# Patient Record
Sex: Male | Born: 1985 | Race: Black or African American | Hispanic: No | Marital: Single | Smoking: Current every day smoker
Health system: Southern US, Community
[De-identification: ages and names within clinical notes are randomized; demographics above are authoritative.]

## PROBLEM LIST (undated history)

## (undated) DIAGNOSIS — M419 Scoliosis, unspecified: Secondary | ICD-10-CM

## (undated) HISTORY — PX: BACK SURGERY: SHX140

---
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
## 2008-04-05 ENCOUNTER — Emergency Department: Payer: Self-pay | Admitting: Emergency Medicine

## 2008-11-18 ENCOUNTER — Emergency Department: Payer: Self-pay | Admitting: Emergency Medicine

## 2008-11-20 ENCOUNTER — Emergency Department: Payer: Self-pay | Admitting: Emergency Medicine

## 2010-03-18 IMAGING — US US PELVIS LIMITED
1 series · 17 of 25 positions shown · non-contrast
Comparison: none

REASON FOR EXAM: left testicle pain
COMMENTS:

[Series 1: us pelvis limited · 17 of 60 slices shown]
[im 1/60]
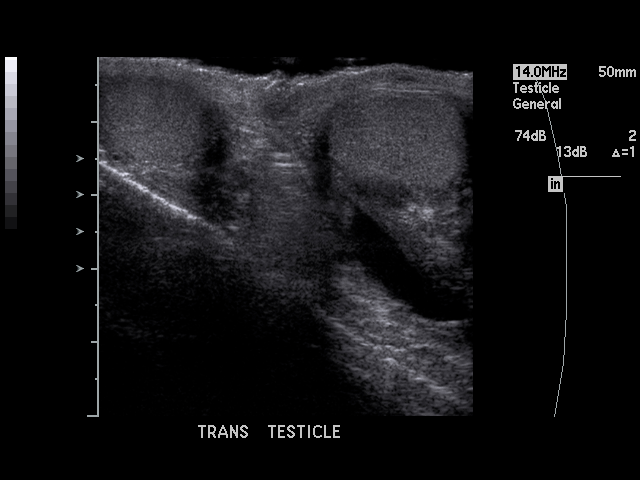
[im 5/60]
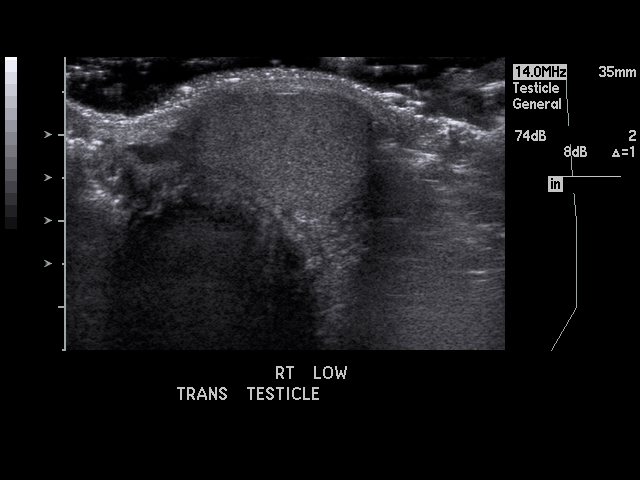
[im 8/60]
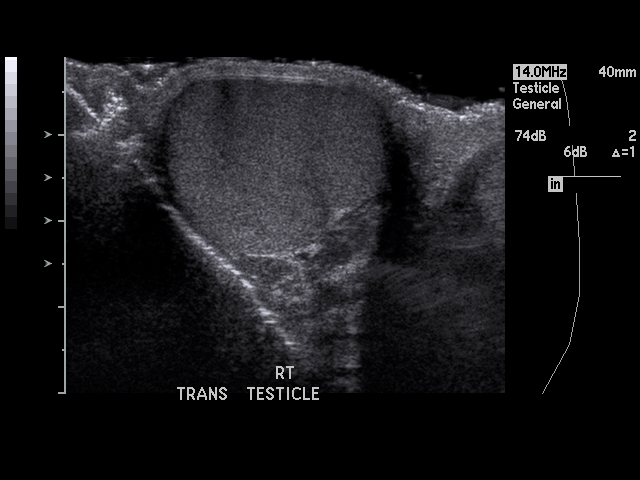
[im 13/60]
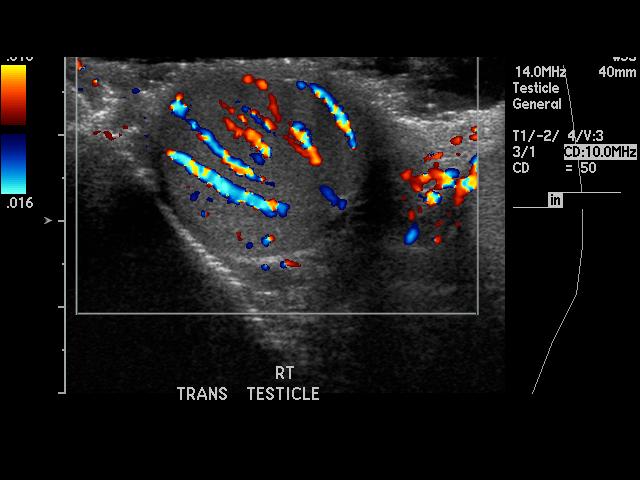
[im 15/60]
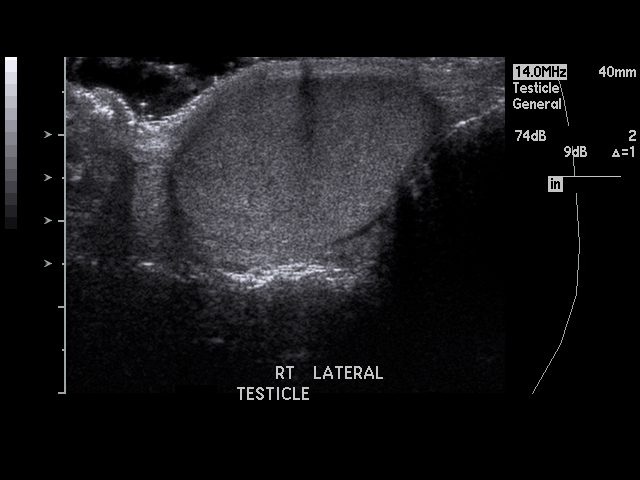
[im 20/60]
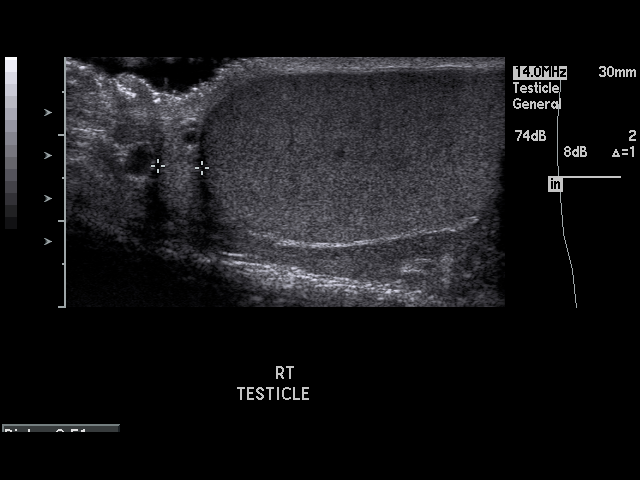
[im 23/60]
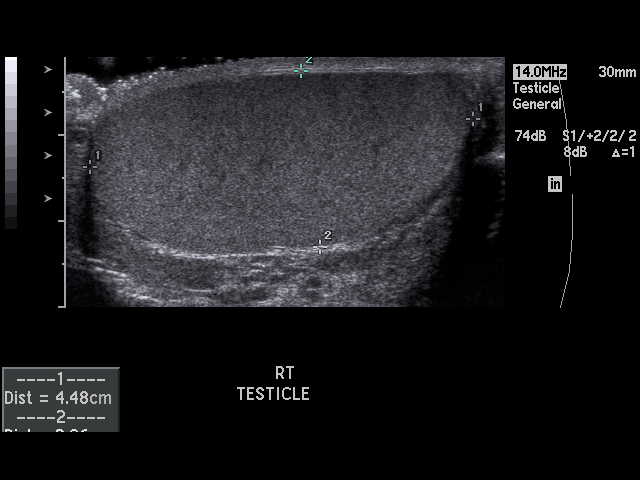
[im 28/60]
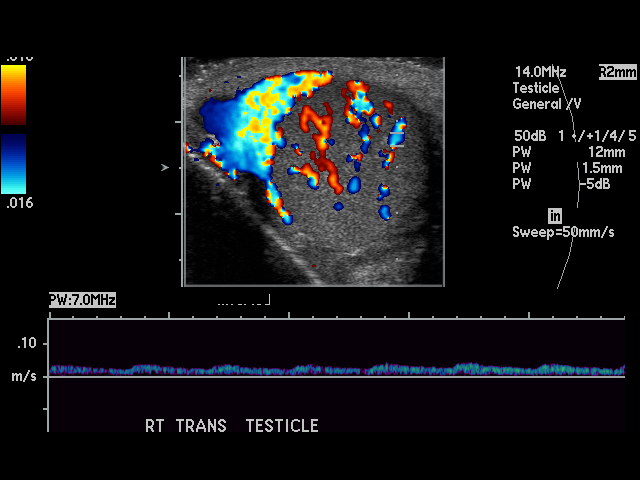
[im 30/60]
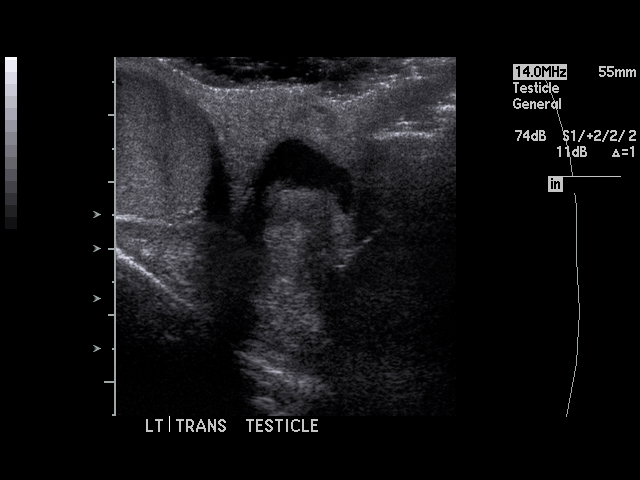
[im 32/60]
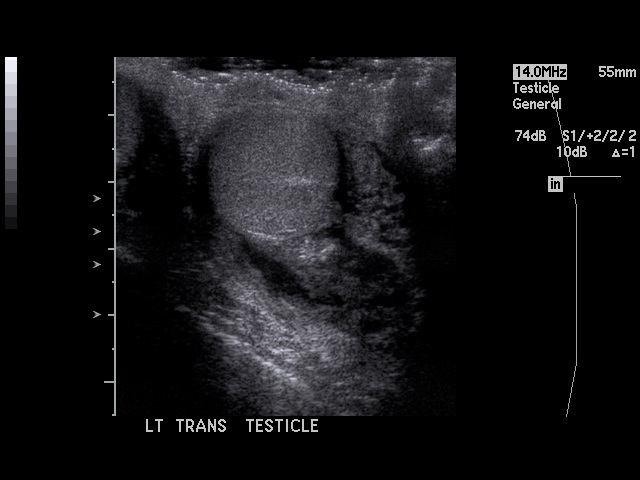
[im 37/60]
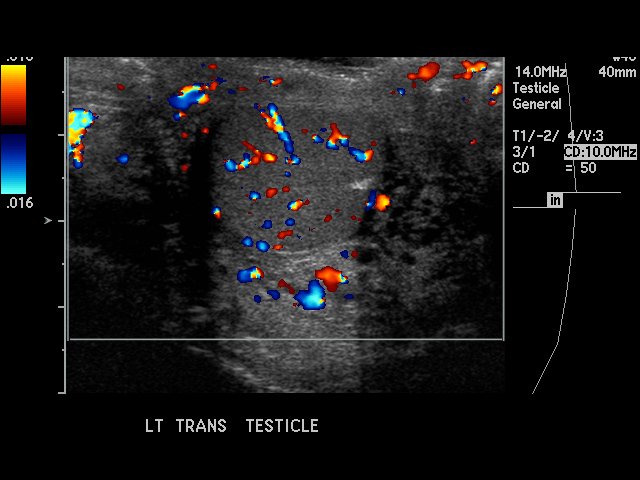
[im 40/60]
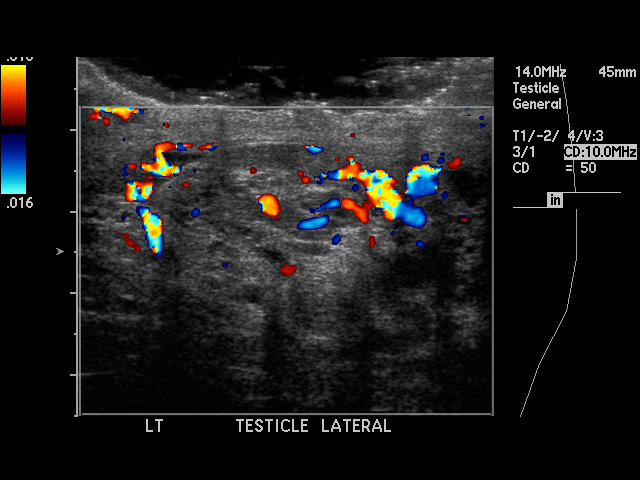
[im 45/60]
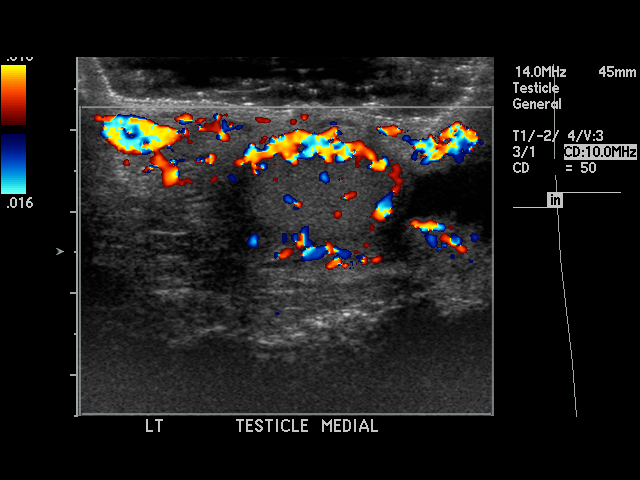
[im 47/60]
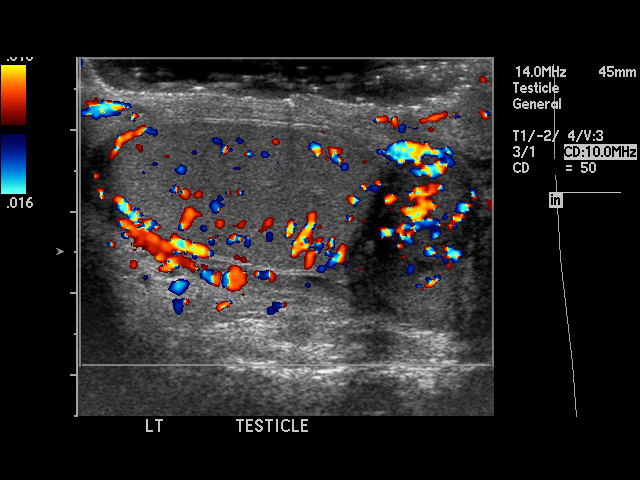
[im 52/60]
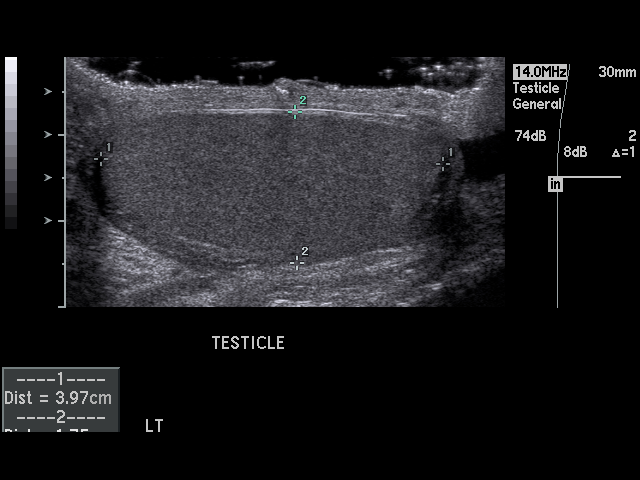
[im 55/60]
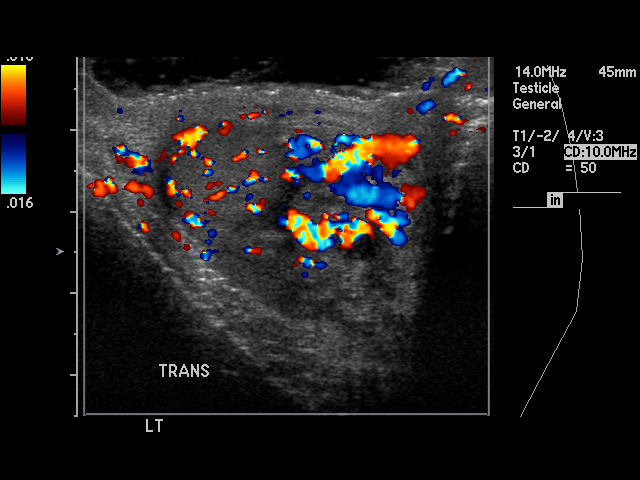
[im 60/60]
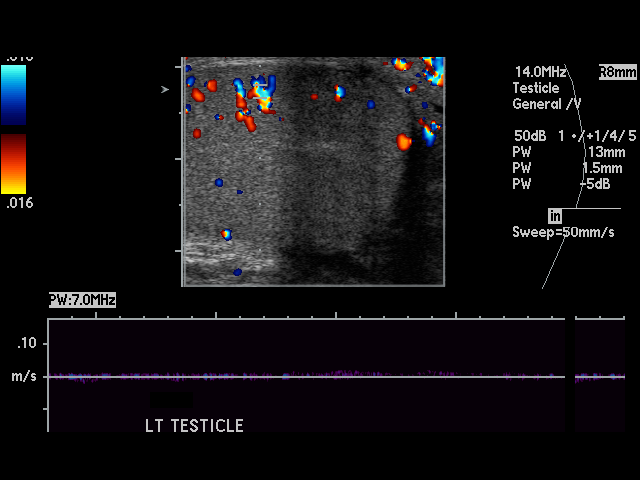

[17 of 25 positions shown; findings below may reference images not displayed]

PROCEDURE:     US  - US TESTICULAR  - April 06, 2008  [DATE]

RESULT:     Grayscale, color flow and SPECTRAL waveform imaging was
performed of the RIGHT and LEFT testicle.
The RIGHT testicle measures 4.48 x 2.06 x 2.79 cm and the LEFT 3.97 x 1.75 x
2.21 cm.  The testicles demonstrate homogenous echotexture and symmetric
flow. A small hydrocele is identified within the LEFT hemiscrotum. The RIGHT
epididymis measures 5.1 mm in diameter and the LEFT 9.8 mm.  There is
increased color flow within the LEFT epididymis when compared to the RIGHT.
There appears to be increased arterial and venous flow.  There does not
appear to be evidence of variocele within the RIGHT or LEFT hemiscrotum.
IMPRESSION: 1.     Findings suspicious for epididymitis on the LEFT as described above.
2.     Small to moderate LEFT hydrocele.
3.     Dr. Jaeoh of the Emergency Department was informed of these
findings at the time of the initial interpretation.

## 2010-03-23 ENCOUNTER — Emergency Department: Payer: Self-pay | Admitting: Emergency Medicine

## 2010-07-10 ENCOUNTER — Emergency Department (HOSPITAL_COMMUNITY)
Admission: EM | Admit: 2010-07-10 | Discharge: 2010-07-11 | Disposition: A | Discharge status: Home / Self Care | Payer: Self-pay | Attending: Emergency Medicine | Admitting: Emergency Medicine

## 2010-07-10 DIAGNOSIS — Z9889 Other specified postprocedural states: Secondary | ICD-10-CM | POA: Insufficient documentation

## 2010-07-10 DIAGNOSIS — G8929 Other chronic pain: Secondary | ICD-10-CM | POA: Insufficient documentation

## 2010-07-10 DIAGNOSIS — M545 Low back pain, unspecified: Secondary | ICD-10-CM | POA: Insufficient documentation

## 2010-07-10 DIAGNOSIS — M79609 Pain in unspecified limb: Secondary | ICD-10-CM | POA: Insufficient documentation

## 2012-10-09 ENCOUNTER — Emergency Department: Payer: Self-pay | Admitting: Emergency Medicine

## 2012-10-13 LAB — BETA STREP CULTURE(ARMC)

## 2013-05-04 ENCOUNTER — Emergency Department: Payer: Self-pay | Admitting: Emergency Medicine

## 2013-11-07 ENCOUNTER — Emergency Department: Payer: Self-pay | Admitting: Emergency Medicine

## 2013-12-24 ENCOUNTER — Emergency Department: Payer: Self-pay | Admitting: Emergency Medicine

## 2014-01-07 ENCOUNTER — Emergency Department: Payer: Self-pay | Admitting: Emergency Medicine

## 2014-02-11 ENCOUNTER — Emergency Department: Payer: Self-pay | Admitting: Emergency Medicine

## 2014-02-27 ENCOUNTER — Emergency Department: Payer: Self-pay | Admitting: Emergency Medicine

## 2014-06-05 ENCOUNTER — Emergency Department: Payer: Self-pay | Admitting: Emergency Medicine

## 2014-06-17 ENCOUNTER — Emergency Department: Payer: Self-pay | Admitting: Emergency Medicine

## 2015-01-17 ENCOUNTER — Emergency Department
Admission: EM | Admit: 2015-01-17 | Discharge: 2015-01-17 | Disposition: A | Discharge status: Home / Self Care | Payer: Self-pay | Attending: Emergency Medicine | Admitting: Emergency Medicine

## 2015-01-17 ENCOUNTER — Encounter: Payer: Self-pay | Admitting: Emergency Medicine

## 2015-01-17 DIAGNOSIS — B9789 Other viral agents as the cause of diseases classified elsewhere: Secondary | ICD-10-CM

## 2015-01-17 DIAGNOSIS — J028 Acute pharyngitis due to other specified organisms: Secondary | ICD-10-CM

## 2015-01-17 DIAGNOSIS — Z720 Tobacco use: Secondary | ICD-10-CM | POA: Insufficient documentation

## 2015-01-17 DIAGNOSIS — J029 Acute pharyngitis, unspecified: Secondary | ICD-10-CM | POA: Insufficient documentation

## 2015-01-17 HISTORY — DX: Scoliosis, unspecified: M41.9

## 2015-01-17 MED ORDER — LORATADINE-PSEUDOEPHEDRINE ER 5-120 MG PO TB12: 1.0000 | ORAL_TABLET | Freq: Two times a day (BID) | ORAL | Status: AC

## 2015-01-17 NOTE — ED Notes (Signed)
Sore throat since last pm  States increased pain with swallowing. No resp distress .Marland Kitchen

## 2015-01-17 NOTE — ED Provider Notes (Signed)
[facility] Emergency Department Provider Note  ____________________________________________  Time seen: Approximately 2:02 PM  I have reviewed the triage vital signs and the nursing notes.   HISTORY  Chief Complaint Sore Throat  HPI Hector Fischer is a 29 y.o. male is here with complaint of sore throat since last evening. Patient states that pain is increased with swallowing. He denies any respiratory difficulty. He denies any fever or chills. He is unaware of any exposure to strep throat. He states he has not taken any medication for this. He acknowledges that he has been sneezing and has been sniffing recently. He rates his discomfort as 8 out of 10.   Past Medical History  Diagnosis Date  . Scoliosis     There are no active problems to display for this patient.   Past Surgical History  Procedure Laterality Date  . Back surgery      Current Outpatient Rx  Name  Route  Sig  Dispense  Refill  . loratadine-pseudoephedrine (CLARITIN-D 12-HOUR) 5-120 MG tablet   Oral   Take 1 tablet by mouth 2 (two) times daily.   20 tablet   0     Allergies Review of patient's allergies indicates no known allergies.  No family history on file.  Social History Social History  Substance Use Topics  . Smoking status: Current Every Day Smoker  . Smokeless tobacco: None  . Alcohol Use: No    Review of Systems Constitutional: No fever/chills Eyes: No visual changes. ENT: Positive sore throat. Positive sneezing Cardiovascular: Denies chest pain. Respiratory: Denies shortness of breath. Gastrointestinal: No abdominal pain.  No nausea, no vomiting.   Genitourinary: Negative for dysuria. Musculoskeletal: Negative for back pain. Skin: Negative for rash. Neurological: Negative for headaches, focal weakness or numbness.  10-point ROS otherwise negative.  ____________________________________________   PHYSICAL EXAM:  VITAL SIGNS: ED Triage Vitals   Enc Vitals Group     BP 01/17/15 1331 135/75 mmHg     Pulse Rate 01/17/15 1331 73     Resp 01/17/15 1331 16     Temp 01/17/15 1331 98.7 F (37.1 C)     Temp Source 01/17/15 1331 Oral     SpO2 01/17/15 1331 97 %     Weight 01/17/15 1331 197 lb (89.359 kg)     Height 01/17/15 1331  (1.803 m)     Head Cir --      Peak Flow --      Pain Score 01/17/15 1331 8     Pain Loc --      Pain Edu? --      Excl. in GC? --     Constitutional: Alert and oriented. Well appearing and in no acute distress. Eyes: Conjunctivae are normal. PERRL. EOMI. Head: Atraumatic. Nose: Positive congestion/no rhinnorhea. Patient with frequent sniffles. Mouth/Throat: Mucous membranes are moist.  Oropharynx non-erythematous. Moderate amount of posterior drainage seen. Neck: No stridor.   Hematological/Lymphatic/Immunilogical: No cervical lymphadenopathy. Cardiovascular: Normal rate, regular rhythm. Grossly normal heart sounds.  Good peripheral circulation. Respiratory: Normal respiratory effort.  No retractions. Lungs CTAB. Gastrointestinal: Soft and nontender. No distention. Musculoskeletal: Moves upper extremities without any difficulty. No lower extremity tenderness nor edema.  No joint effusions. Neurologic:  Normal speech and language. No gross focal neurologic deficits are appreciated. No gait instability. Skin:  Skin is warm, dry and intact. No rash noted. Psychiatric: Mood and affect are normal. Speech and behavior are normal.  ____________________________________________   LABS (all labs ordered are  listed, but only abnormal results are displayed)  Labs Reviewed  CULTURE, GROUP A STREP ([facility] ONLY)    PROCEDURES  Procedure(s) performed: None  Critical Care performed: No  ____________________________________________   INITIAL IMPRESSION / ASSESSMENT AND PLAN / ED COURSE  Pertinent labs & imaging results that were available during my care of the patient were reviewed by me and  considered in my medical decision making (see chart for details).  Patient was started on Claritin-D every 12 hours. He was encouraged take Tylenol or ibuprofen as needed for throat pain. He is also to increase fluids. He is to follow-up with [facility] if any continued problems. ____________________________________________   FINAL CLINICAL IMPRESSION(S) / ED DIAGNOSES  Final diagnoses:  Acute viral pharyngitis      Tommi Rumps, PA-C 01/17/15 1455  Jene Every, MD 01/17/15 [phone]

## 2015-01-17 NOTE — ED Notes (Signed)
Pt reports sore throat since last night, reports difficulty swallowing. Pt handling secretions well and in no respiratory distress.

## 2015-01-17 NOTE — Discharge Instructions (Signed)

## 2015-01-19 LAB — CULTURE, GROUP A STREP (THRC)

## 2015-03-30 ENCOUNTER — Emergency Department
Admission: EM | Admit: 2015-03-30 | Discharge: 2015-03-30 | Disposition: A | Discharge status: Home / Self Care | Payer: Self-pay | Attending: Emergency Medicine | Admitting: Emergency Medicine

## 2015-03-30 ENCOUNTER — Encounter: Payer: Self-pay | Admitting: Emergency Medicine

## 2015-03-30 DIAGNOSIS — M436 Torticollis: Secondary | ICD-10-CM | POA: Insufficient documentation

## 2015-03-30 DIAGNOSIS — Z79899 Other long term (current) drug therapy: Secondary | ICD-10-CM | POA: Insufficient documentation

## 2015-03-30 DIAGNOSIS — M419 Scoliosis, unspecified: Secondary | ICD-10-CM | POA: Insufficient documentation

## 2015-03-30 DIAGNOSIS — F17200 Nicotine dependence, unspecified, uncomplicated: Secondary | ICD-10-CM | POA: Insufficient documentation

## 2015-03-30 DIAGNOSIS — G8929 Other chronic pain: Secondary | ICD-10-CM | POA: Insufficient documentation

## 2015-03-30 DIAGNOSIS — M549 Dorsalgia, unspecified: Secondary | ICD-10-CM | POA: Insufficient documentation

## 2015-03-30 MED ORDER — CYCLOBENZAPRINE HCL 10 MG PO TABS: 10.0000 mg | ORAL_TABLET | Freq: Every day | ORAL | Status: DC

## 2015-03-30 MED ORDER — NAPROXEN 500 MG PO TABS: 500.0000 mg | ORAL_TABLET | Freq: Two times a day (BID) | ORAL | Status: AC

## 2015-03-30 NOTE — ED Provider Notes (Signed)
CSN: [id number]     Arrival date & time 03/30/15  1011 History   First MD Initiated Contact with Patient 03/30/15 1050     Chief Complaint  Patient presents with  . Back Pain  . Leg Pain      HPI Comments: 29 year old male with a history of scoliosis presents today complaining of neck and back pain that started around 3am. Pt has a long history of chronic back pain due to scoliosis that has required surgery. He is not being followed by anyone for his back pain. He is most concerned that his neck hurts and it radiates into his right arm. He does not do any heavy lifting in his job, took an old prescription of a muscle relaxer around 3am with some relief. No tingling or numbness down his arms or legs. No loss of bowel or bladder function.   The history is provided by the patient.    Past Medical History  Diagnosis Date  . Scoliosis    Past Surgical History  Procedure Laterality Date  . Back surgery     No family history on file. Social History  Substance Use Topics  . Smoking status: Current Every Day Smoker  . Smokeless tobacco: None  . Alcohol Use: Yes    Review of Systems  Musculoskeletal: Positive for myalgias, back pain, arthralgias and neck pain.  Neurological: Negative for weakness and numbness.  All other systems reviewed and are negative.     Allergies  Review of patient's allergies indicates no known allergies.  Home Medications   Prior to Admission medications   Medication Sig Start Date End Date Taking? Authorizing Provider  cyclobenzaprine (FLEXERIL) 10 MG tablet Take 1 tablet (10 mg total) by mouth at bedtime. 03/30/15   Christella Scheuermann, PA-C  loratadine-pseudoephedrine (CLARITIN-D 12-HOUR) 5-120 MG tablet Take 1 tablet by mouth 2 (two) times daily. 01/17/15   Tommi Rumps, PA-C  naproxen (NAPROSYN) 500 MG tablet Take 1 tablet (500 mg total) by mouth 2 (two) times daily with a meal. 03/30/15   Christella Scheuermann, PA-C   BP 142/81 mmHg  Pulse 59   Temp(Src) 98.1 F (36.7 C) (Oral)  Resp 18  Ht  (1.803 m)  Wt 97.523 kg  BMI 30.00 kg/m2  SpO2 99% Physical Exam  Constitutional: He is oriented to person, place, and time. Vital signs are normal. He appears well-developed and well-nourished. He is active.  Non-toxic appearance. He does not have a sickly appearance. He does not appear ill.  HENT:  Head: Normocephalic and atraumatic.  Neck: Trachea normal and phonation normal. Neck supple. Muscular tenderness present. No spinous process tenderness present. No rigidity.  Noted muscle spasm left trapezius   Musculoskeletal: He exhibits tenderness.       Right hip: Normal. He exhibits normal range of motion and no tenderness.       Left hip: Normal. He exhibits normal range of motion and no tenderness.       Lumbar back: He exhibits tenderness. He exhibits normal range of motion.  Lymphadenopathy:    He has no cervical adenopathy.  Neurological: He is alert and oriented to person, place, and time. He has normal strength. No sensory deficit. Gait normal.  Equal strength 5/5 bilateral LE. Negative SLR bilaterally.   Skin: Skin is warm and dry.  Psychiatric: He has a normal mood and affect. His behavior is normal. Judgment and thought content normal.  Nursing note and vitals reviewed.   ED  Course  Procedures (including critical care time) Labs Review Labs Reviewed - No data to display  Imaging Review No results found. I have personally reviewed and evaluated these images and lab results as part of my medical decision-making.   EKG Interpretation None      MDM  Pt with chronic back pain, no red flag warning signs associated with it Has noted muscle spasm in neck, non traumatic neck pain - no indication for imaging Naproxen BId with food, flexeril QHS - do not drive or work after taking MOist heat, stretching for neck and back. Encouraged him to establish care with a primary care doctor for caer of chronic LBP Final  diagnoses:  Scoliosis  Torticollis      Christella Scheuermannmma Emillia Weatherly V, PA-C 03/30/15 1158  Emily FilbertJonathan E Williams, MD 03/30/15 [phone]

## 2015-03-30 NOTE — ED Notes (Signed)
Says woke up this am with pain in back, right side/shoulder and leg.  Pain increases with movement.  Does not recall any injury, but was okay yesterday.  No fever, no n/v.

## 2015-03-30 NOTE — Discharge Instructions (Signed)
Acute Torticollis °Torticollis is a condition in which the muscles of the neck tighten (contract) abnormally, causing the neck to twist and the head to move into an unnatural position. Torticollis that develops suddenly is called acute torticollis. If torticollis becomes chronic and is left untreated, the face and neck can become deformed. °CAUSES °This condition may be caused by: °· Sleeping in an awkward position (common). °· Extending or twisting the neck muscles beyond their normal position. °· Infection. °In some cases, the cause may not be known. °SYMPTOMS °Symptoms of this condition include: °· An unnatural position of the head. °· Neck pain. °· A limited ability to move the neck. °· Twisting of the neck to one side. °DIAGNOSIS °This condition is diagnosed with a physical exam. You may also have imaging tests, such as an X-ray, CT scan, or MRI. °TREATMENT °Treatment for this condition involves trying to relax the neck muscles. It may include: °· Medicines or shots. °· Physical therapy. °· Surgery. This may be done in severe cases. °HOME CARE INSTRUCTIONS °· Take medicines only as directed by your health care provider. °· Do stretching exercises and massage your neck as directed by your health care provider. °· Keep all follow-up visits as directed by your health care provider. This is important. °SEEK MEDICAL CARE IF: °· You develop a fever. °SEEK IMMEDIATE MEDICAL CARE IF: °· You develop difficulty breathing. °· You develop noisy breathing (stridor). °· You start drooling. °· You have trouble swallowing or have pain with swallowing. °· You develop numbness or weakness in your hands or feet. °· You have changes in your speech, understanding, or vision. °· Your pain gets worse. °  °This information is not intended to replace advice given to you by your health care provider. Make sure you discuss any questions you have with your health care provider. °  °Document Released: 03/24/2000 Document Revised:  08/11/2014 Document Reviewed: 03/23/2014 °Elsevier Interactive Patient Education ©2016 [employer] ° °

## 2016-01-23 IMAGING — CR DG CHEST 2V
1 series · 2 of 2 positions shown · non-contrast
Comparison: 01/07/2014

CLINICAL DATA: Cough and dyspnea.

EXAM:
CHEST  2 VIEW

[Series 1: dxr chest pa (or ap) and lateral · 0.14mm/px · 2 of 2 slices shown]
[im 1/2]
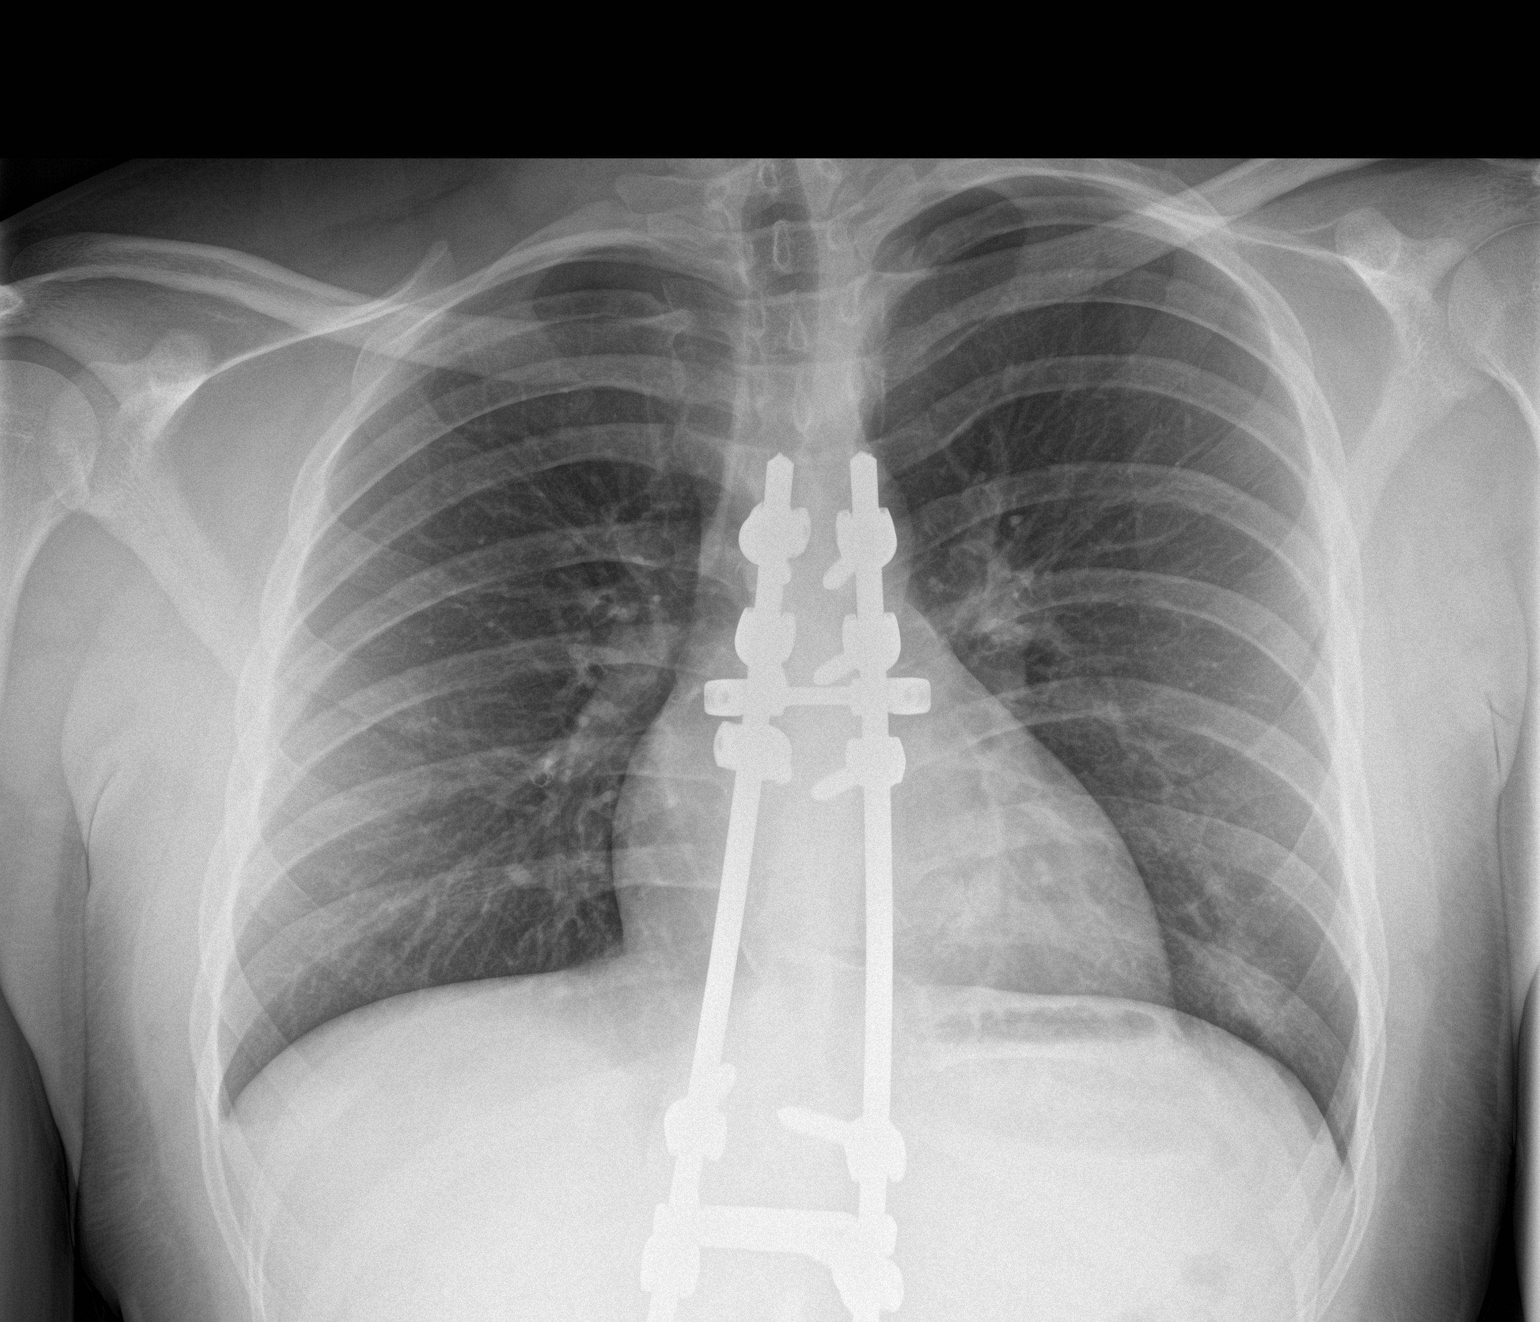
[im 2/2]
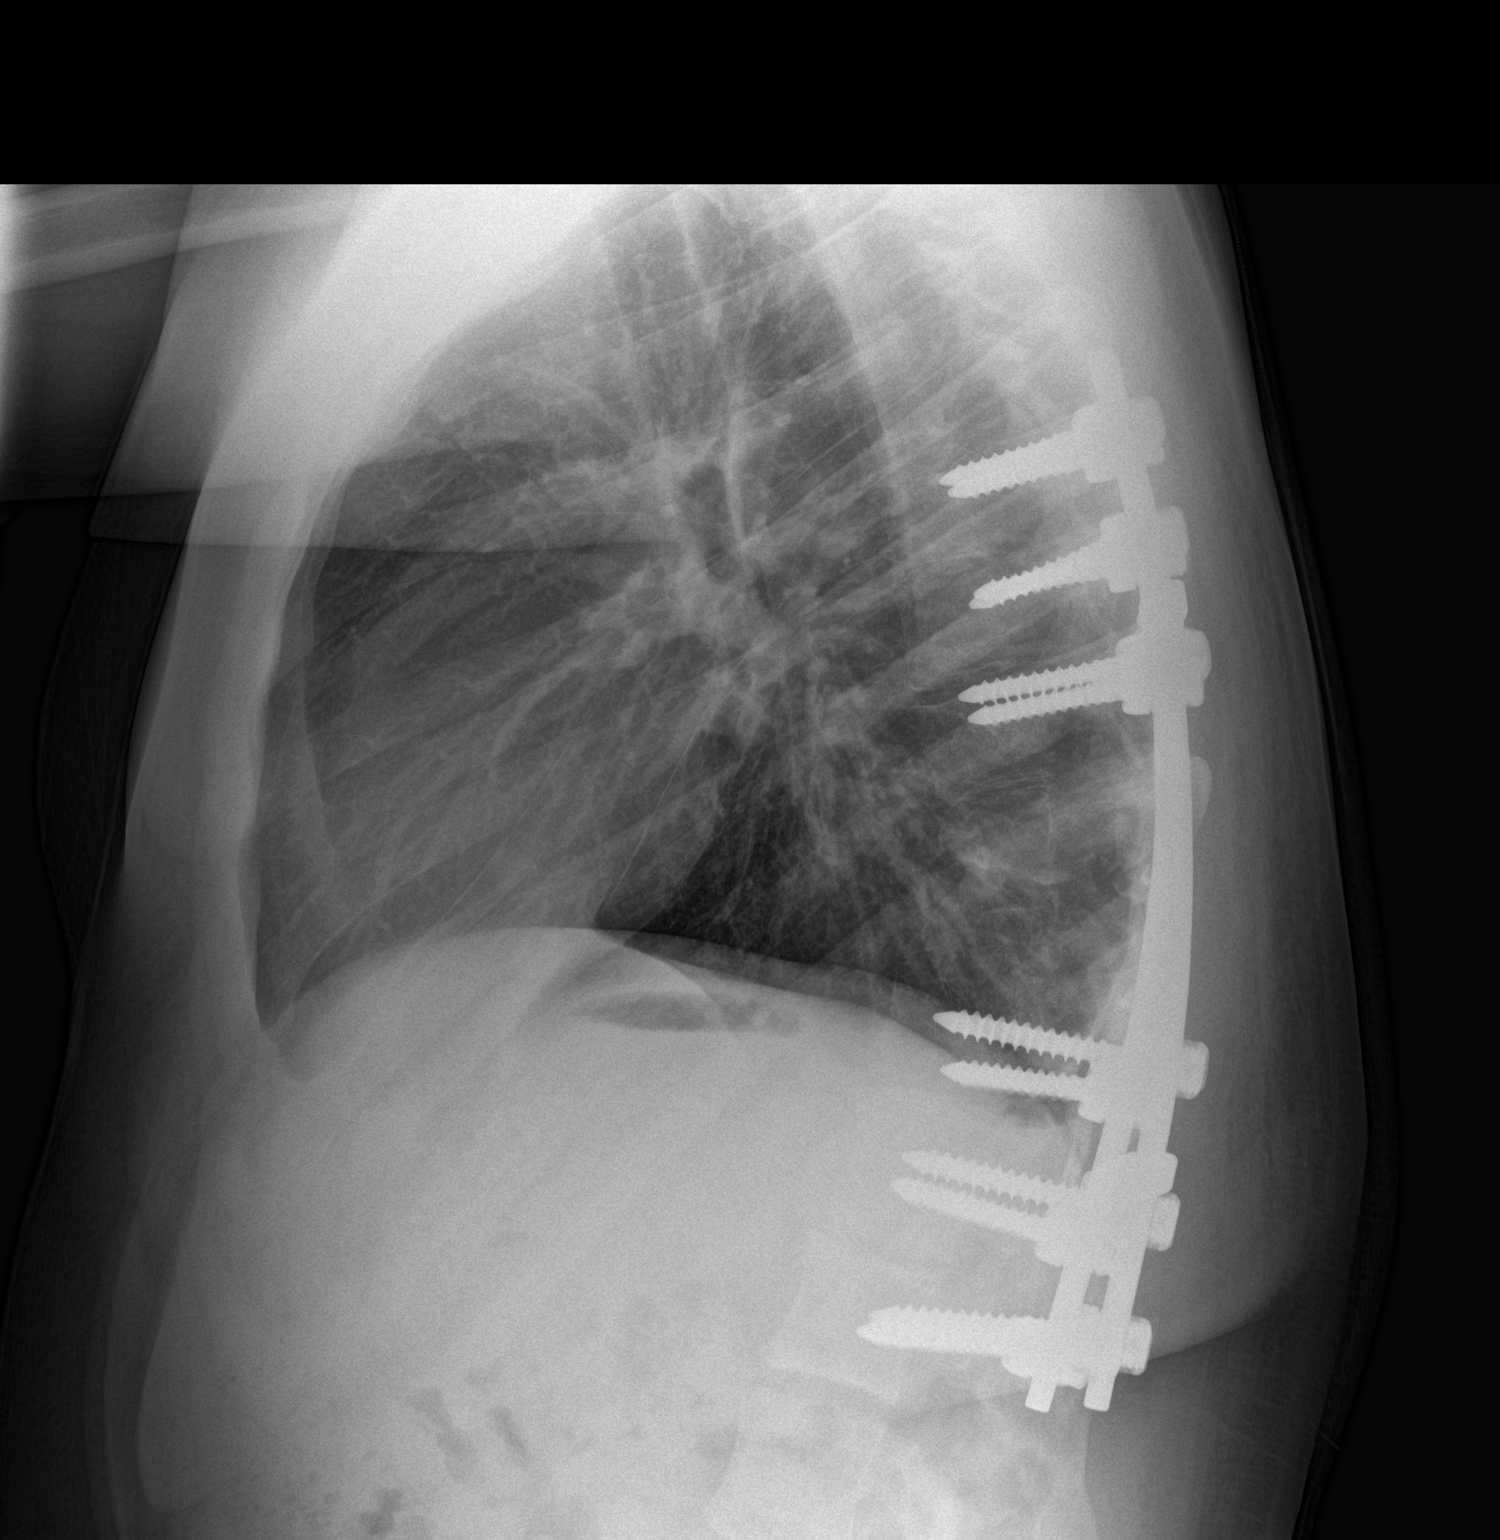

[2 of 2 positions shown; findings below may reference images not displayed]

FINDINGS: Two views of the chest again demonstrate surgical hardware in the
thoracic and lumbar spine. Stable appearance of the spine on the
lateral view. There are subtle densities at the left lung base.
Remainder of the lungs are clear. Heart size is normal.
IMPRESSION: Subtle densities at the left lung base. Findings could be related to
overlying shadows but cannot exclude a subtle focus of infection.

## 2016-09-18 ENCOUNTER — Encounter: Payer: Self-pay | Admitting: Emergency Medicine

## 2016-09-18 ENCOUNTER — Emergency Department
Admission: EM | Admit: 2016-09-18 | Discharge: 2016-09-18 | Disposition: A | Discharge status: Home / Self Care | Payer: BLUE CROSS/BLUE SHIELD | Attending: Emergency Medicine | Admitting: Emergency Medicine

## 2016-09-18 DIAGNOSIS — B349 Viral infection, unspecified: Secondary | ICD-10-CM | POA: Diagnosis not present

## 2016-09-18 DIAGNOSIS — R509 Fever, unspecified: Secondary | ICD-10-CM | POA: Diagnosis not present

## 2016-09-18 DIAGNOSIS — F17200 Nicotine dependence, unspecified, uncomplicated: Secondary | ICD-10-CM | POA: Insufficient documentation

## 2016-09-18 DIAGNOSIS — J029 Acute pharyngitis, unspecified: Secondary | ICD-10-CM | POA: Diagnosis present

## 2016-09-18 LAB — POCT RAPID STREP A: Streptococcus, Group A Screen (Direct): NEGATIVE

## 2016-09-18 MED ORDER — MAGIC MOUTHWASH W/LIDOCAINE: 5.0000 mL | Freq: Four times a day (QID) | ORAL | 0 refills | Status: AC

## 2016-09-18 MED ORDER — IBUPROFEN 800 MG PO TABS: 800.0000 mg | ORAL_TABLET | Freq: Three times a day (TID) | ORAL | 0 refills | Status: DC | PRN

## 2016-09-18 MED ORDER — PSEUDOEPH-BROMPHEN-DM 30-2-10 MG/5ML PO SYRP: 5.0000 mL | ORAL_SOLUTION | Freq: Four times a day (QID) | ORAL | 0 refills | Status: AC | PRN

## 2016-09-18 MED ORDER — KETOROLAC TROMETHAMINE 60 MG/2ML IM SOLN
60.0000 mg | Freq: Once | INTRAMUSCULAR | Status: AC
  Administered 2016-09-18: 60 mg via INTRAMUSCULAR
  Filled 2016-09-18: qty 2

## 2016-09-18 NOTE — ED Triage Notes (Signed)
States he developed sore throat and body aches this am

## 2016-09-18 NOTE — ED Provider Notes (Signed)
[facility] Emergency Department Provider Note   ____________________________________________   None    (approximate)  I have reviewed the triage vital signs and the nursing notes.   HISTORY  Chief Complaint Sore Throat and Fever    HPI Hector Fischer is a 31 y.o. male . Patient complaining of sore throat and body aches upon a.m. Awakening.  Patient also stated subjective fever and chills. Patient rates the pain as a 7/10. She describes pain as "achy". No palliative measures for complaint.   Past Medical History:  Diagnosis Date  . Scoliosis     There are no active problems to display for this patient.   Past Surgical History:  Procedure Laterality Date  . BACK SURGERY      Prior to Admission medications   Medication Sig Start Date End Date Taking? Authorizing Provider  brompheniramine-pseudoephedrine-DM 30-2-10 MG/5ML syrup Take 5 mLs by mouth 4 (four) times daily as needed. 09/18/16   Joni ReiningSmith, Duane Earnshaw K, PA-C  cyclobenzaprine (FLEXERIL) 10 MG tablet Take 1 tablet (10 mg total) by mouth at bedtime. 03/30/15   Christella ScheuermannLawrence, Emma V, PA-C  ibuprofen (ADVIL,MOTRIN) 800 MG tablet Take 1 tablet (800 mg total) by mouth every 8 (eight) hours as needed for moderate pain. 09/18/16   Joni ReiningSmith, Herold Salguero K, PA-C  loratadine-pseudoephedrine (CLARITIN-D 12-HOUR) 5-120 MG tablet Take 1 tablet by mouth 2 (two) times daily. 01/17/15   Tommi RumpsSummers, Rhonda L, PA-C  magic mouthwash w/lidocaine SOLN Take 5 mLs by mouth 4 (four) times daily. 09/18/16   Joni ReiningSmith, Karli Wickizer K, PA-C  naproxen (NAPROSYN) 500 MG tablet Take 1 tablet (500 mg total) by mouth 2 (two) times daily with a meal. 03/30/15   Christella ScheuermannLawrence, Emma V, PA-C    Allergies Patient has no known allergies.  No family history on file.  Social History Social History  Substance Use Topics  . Smoking status: Current Every Day Smoker  . Smokeless tobacco: Never Used  . Alcohol use Yes    Review of Systems  Constitutional:   Fever and body ache Eyes: No visual changes. ENT:  Sore throat  And nasal congestion Cardiovascular: Denies chest pain. Respiratory: Denies shortness of breath.  And nonproductive cough Musculoskeletal:  And diffuse body ache Skin: Negative for rash. Neurological: Negative for headaches, focal weakness or numbness.  ____________________________________________   PHYSICAL EXAM:  VITAL SIGNS: ED Triage Vitals  Enc Vitals Group     BP 09/18/16 1043 137/72     Pulse Rate 09/18/16 1043 (!) 101     Resp 09/18/16 1043 18     Temp 09/18/16 1043 99.4 F (37.4 C)     Temp Source 09/18/16 1043 Oral     SpO2 09/18/16 1043 98 %     Weight 09/18/16 1044 230 lb (104.3 kg)     Height 09/18/16 1044 5\' 11"  (1.803 m)     Head Circumference --      Peak Flow --      Pain Score 09/18/16 1049 7     Pain Loc --      Pain Edu? --      Excl. in GC? --     Constitutional: Alert and oriented. Well appearing and in no acute distress. Low-grade fever Eyes: Conjunctivae are normal. PERRL. EOMI. Head: Atraumatic. Nose:  Edematous nasal terms postnasal drainage. Mouth/Throat: Mucous membranes are moist.  Oropharynx non-erythematous.  Edematous tonsils without exudate. Neck: No stridor.  No cervical spine tenderness to palpation. Cardiovascular: Normal rate, regular rhythm. Grossly normal  heart sounds.  Good peripheral circulation. Respiratory: Normal respiratory effort.  No retractions. Lungs CTAB. Gastrointestinal: Soft and nontender. No distention. No abdominal bruits. No CVA tenderness. Musculoskeletal: No lower extremity tenderness nor edema.  No joint effusions. Neurologic:  Normal speech and language. No gross focal neurologic deficits are appreciated. No gait instability. Skin:  Skin is warm, dry and intact. No rash noted. Psychiatric: Mood and affect are normal. Speech and behavior are normal.  ____________________________________________   LABS (all labs ordered are listed, but only  abnormal results are displayed)  Labs Reviewed  POCT RAPID STREP A   ____________________________________________  EKG   ____________________________________________  RADIOLOGY  No results found.  ____________________________________________   PROCEDURES  Procedure(s) performed: None  Procedures  Critical Care performed: No  ____________________________________________   INITIAL IMPRESSION / ASSESSMENT AND PLAN / ED COURSE  Pertinent labs & imaging results that were available during my care of the patient were reviewed by me and considered in my medical decision making (see chart for details).  Viral illness. Discussed negative rapid strep test with patient advised culture pending. Patient given discharge care instructions.      ____________________________________________   FINAL CLINICAL IMPRESSION(S) / ED DIAGNOSES  Final diagnoses:  Viral illness  Sore throat      NEW MEDICATIONS STARTED DURING THIS VISIT:  New Prescriptions   BROMPHENIRAMINE-PSEUDOEPHEDRINE-DM 30-2-10 MG/5ML SYRUP    Take 5 mLs by mouth 4 (four) times daily as needed.   IBUPROFEN (ADVIL,MOTRIN) 800 MG TABLET    Take 1 tablet (800 mg total) by mouth every 8 (eight) hours as needed for moderate pain.   MAGIC MOUTHWASH W/LIDOCAINE SOLN    Take 5 mLs by mouth 4 (four) times daily.     Note:  This document was prepared using Dragon voice recognition software and may include unintentional dictation errors.    Joni Reining, PA-C 09/18/16 1136    Merrily Brittle, MD 09/18/16 [phone]

## 2022-05-20 ENCOUNTER — Encounter (HOSPITAL_COMMUNITY): Payer: Self-pay

## 2022-05-20 ENCOUNTER — Other Ambulatory Visit: Payer: Self-pay

## 2022-05-20 ENCOUNTER — Emergency Department (HOSPITAL_COMMUNITY)
Admission: EM | Admit: 2022-05-20 | Discharge: 2022-05-20 | Disposition: A | Discharge status: Home / Self Care | Payer: BC Managed Care – PPO | Attending: Emergency Medicine | Admitting: Emergency Medicine

## 2022-05-20 DIAGNOSIS — L02211 Cutaneous abscess of abdominal wall: Secondary | ICD-10-CM | POA: Insufficient documentation

## 2022-05-20 DIAGNOSIS — L0291 Cutaneous abscess, unspecified: Secondary | ICD-10-CM

## 2022-05-20 MED ORDER — DOXYCYCLINE HYCLATE 100 MG PO CAPS: 100.0000 mg | ORAL_CAPSULE | Freq: Two times a day (BID) | ORAL | 0 refills | Status: AC

## 2022-05-20 MED ORDER — KETOROLAC TROMETHAMINE 30 MG/ML IJ SOLN
30.0000 mg | Freq: Once | INTRAMUSCULAR | Status: AC
  Administered 2022-05-20: 30 mg via INTRAMUSCULAR
  Filled 2022-05-20: qty 1

## 2022-05-20 MED ORDER — LIDOCAINE-EPINEPHRINE (PF) 2 %-1:200000 IJ SOLN
10.0000 mL | Freq: Once | INTRAMUSCULAR | Status: AC
  Administered 2022-05-20: 10 mL
  Filled 2022-05-20: qty 20

## 2022-05-20 NOTE — ED Provider Notes (Signed)
[facility] Provider Note   CSN: [id number] Arrival date & time: 05/20/22  1626     History  Chief Complaint  Patient presents with   Abscess    Hector Fischer is a 37 y.o. male, no pertinent past medical history, who presents to the ED secondary to swelling, redness to the left lower abdomen, for the last year.  He states that he intermittently gets abscesses or bumps, last one was on his armpit and had a drain, but the last year he has had some swelling, redness to the left lower abdomen, that is very painful.  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  States that is really bugging him and the pain is so bad, that he cannot go to work.     Home Medications Prior to Admission medications   Medication Sig Start Date End Date Taking? Authorizing Provider  doxycycline (VIBRAMYCIN) 100 MG capsule Take 1 capsule (100 mg total) by mouth 2 (two) times daily. 05/20/22  Yes Merrill Villarruel L, PA  brompheniramine-pseudoephedrine-DM 30-2-10 MG/5ML syrup Take 5 mLs by mouth 4 (four) times daily as needed. 09/18/16   Sable Feil, PA-C  cyclobenzaprine (FLEXERIL) 10 MG tablet Take 1 tablet (10 mg total) by mouth at bedtime. 03/30/15   Harvest Dark, PA-C  ibuprofen (ADVIL,MOTRIN) 800 MG tablet Take 1 tablet (800 mg total) by mouth every 8 (eight) hours as needed for moderate pain. 09/18/16   Sable Feil, PA-C  loratadine-pseudoephedrine (CLARITIN-D 12-HOUR) 5-120 MG tablet Take 1 tablet by mouth 2 (two) times daily. 01/17/15   Johnn Hai, PA-C  magic mouthwash w/lidocaine SOLN Take 5 mLs by mouth 4 (four) times daily. 09/18/16   Sable Feil, PA-C  naproxen (NAPROSYN) 500 MG tablet Take 1 tablet (500 mg total) by mouth 2 (two) times daily with a meal. 03/30/15   Harvest Dark, PA-C      Allergies    Hydrocodone-acetaminophen    Review of Systems   Review  of Systems  Skin:  Positive for rash.    Physical Exam Updated Vital Signs BP (!) 164/99   Pulse 85   Temp 98.1 F (36.7 C) (Oral)   Resp 17   Ht 5' 11"$  (1.803 m)   Wt 104.3 kg   SpO2 99%   BMI 32.08 kg/m  Physical Exam Vitals and nursing note reviewed.  Constitutional:      General: He is not in acute distress.    Appearance: He is well-developed.  HENT:     Head: Normocephalic and atraumatic.  Eyes:     Conjunctiva/sclera: Conjunctivae normal.  Cardiovascular:     Rate and Rhythm: Normal rate and regular rhythm.     Heart sounds: No murmur heard. Pulmonary:     Effort: Pulmonary effort is normal. No respiratory distress.     Breath sounds: Normal breath sounds.  Abdominal:     Palpations: Abdomen is soft.     Tenderness: There is no abdominal tenderness.  Musculoskeletal:        General: No swelling.     Cervical back: Neck supple.  Skin:    General: Skin is warm and dry.     Capillary Refill: Capillary refill takes less than 2 seconds.     Comments: Erythema next to the left lower beltline, with a induration, and then a fluctuant mass, laterally  to the induration, that is pustular.  Neurological:     Mental Status: He is alert.  Psychiatric:        Mood and Affect: Mood normal.     ED Results / Procedures / Treatments   Labs (all labs ordered are listed, but only abnormal results are displayed) Labs Reviewed - No data to display  EKG None  Radiology No results found.  Procedures .Marland KitchenIncision and Drainage  Date/Time: 05/20/2022 6:23 PM  Performed by: Osvaldo Shipper, PA Authorized by: Osvaldo Shipper, PA   Consent:    Consent obtained:  Verbal   Consent given by:  Patient   Risks, benefits, and alternatives were discussed: yes     Risks discussed:  Bleeding, incomplete drainage, pain, infection and damage to other organs   Alternatives discussed:  Alternative treatment and no treatment Universal protocol:    Patient identity confirmed:  Verbally  with patient Location:    Type:  Abscess   Size:  3x1cm   Location:  Trunk   Trunk location:  Abdomen Pre-procedure details:    Skin preparation:  Chlorhexidine Sedation:    Sedation type:  None Anesthesia:    Anesthesia method:  Local infiltration Procedure type:    Complexity:  Simple Procedure details:    Incision types:  Stab incision   Incision depth:  Dermal   Wound management:  Probed and deloculated   Drainage:  Purulent and bloody   Drainage amount:  Copious   Wound treatment:  Wound left open   Packing materials:  None Post-procedure details:    Procedure completion:  Tolerated Comments:     Tolerated procedure, complication included blood borne exposure to the provider when with lidocaine injection to abscess, purulent bloody material squirted into provider's eye and thus infectious blood borne exposure work-up initiated--HIV, hepatitis. Patient was informed of this and was agreeable to testing. Pt should not be charged for this.  Ultrasound ED Soft Tissue  Date/Time: 05/20/2022 6:20 PM  Performed by: Osvaldo Shipper, PA Authorized by: Osvaldo Shipper, PA   Procedure details:    Indications: localization of abscess and evaluate for cellulitis     Transverse view:  Visualized   Longitudinal view:  Visualized   Images: not archived   Location:    Location: abdominal wall     Side:  Left Findings:     abscess present    cellulitis present Comments:     Cellulitis of more medial area, then 3x1cm abscess localized laterally   Medications Ordered in ED Medications  lidocaine-EPINEPHrine (XYLOCAINE W/EPI) 2 %-1:200000 (PF) injection 10 mL (10 mLs Other Given 05/20/22 1803)  ketorolac (TORADOL) 30 MG/ML injection 30 mg (30 mg Intramuscular Given 05/20/22 1803)    ED Course/ Medical Decision Making/ A&P                             Medical Decision Making Patient is a 37 year old male, no pertinent past medical history, who presents to the ED secondary to a  bump on his stomach that is been there for the past year, that he states has just become more painful, on exam appears to be a abscess, and then an edematous area which she was shown to be cellulitis per imaging utilizing ultrasound bedside.  Incision and drainage was performed, during this there was complication in which the blood in/purulent material squirted into the PA's eye, patient had to have screenings performed including HIV  and hepatitis given exposure to the employee, he was informed of this and should not be charged.  He tolerated the procedure well.  And was discharged home with doxycycline and instructions to follow-up with dermatology given recurrent abscesses.  Risk Prescription drug management.    Final Clinical Impression(s) / ED Diagnoses Final diagnoses:  Abscess    Rx / DC Orders ED Discharge Orders          Ordered    doxycycline (VIBRAMYCIN) 100 MG capsule  2 times daily        05/20/22 1915              Areon Cocuzza, Si Gaul, [place] 05/20/22 2046    Lennice Sites, DO 05/20/22 2100

## 2022-05-20 NOTE — Discharge Instructions (Addendum)
Please take the antibiotic as prescribed, and make sure your area is clean and dry at all times.  Make sure that it if you develop increased redness, warmth, swelling please return to the ER.  Please also follow-up with dermatology as this appears to be a recurrent issue.  You can use baby powder to help with moisture, once your incision has healed.

## 2022-05-20 NOTE — ED Notes (Signed)
Pt discharged by PA.

## 2022-05-20 NOTE — ED Triage Notes (Signed)
Reports has a bump under stomach that has been coming and going for about a year.  Was seen before and told they would have to cut his skin off to get rid of it and wanted a second opinion.

## 2022-06-20 ENCOUNTER — Other Ambulatory Visit: Payer: Self-pay

## 2022-06-20 ENCOUNTER — Emergency Department (HOSPITAL_COMMUNITY)
Admission: EM | Admit: 2022-06-20 | Discharge: 2022-06-20 | Disposition: A | Discharge status: Home / Self Care | Payer: BC Managed Care – PPO | Attending: Emergency Medicine | Admitting: Emergency Medicine

## 2022-06-20 ENCOUNTER — Encounter (HOSPITAL_COMMUNITY): Payer: Self-pay

## 2022-06-20 DIAGNOSIS — M545 Low back pain, unspecified: Secondary | ICD-10-CM | POA: Insufficient documentation

## 2022-06-20 MED ORDER — IBUPROFEN 800 MG PO TABS: 800.0000 mg | ORAL_TABLET | Freq: Three times a day (TID) | ORAL | 0 refills | Status: AC

## 2022-06-20 MED ORDER — CYCLOBENZAPRINE HCL 10 MG PO TABS: 10.0000 mg | ORAL_TABLET | Freq: Two times a day (BID) | ORAL | 0 refills | Status: AC | PRN

## 2022-06-20 NOTE — Discharge Instructions (Signed)
You have been seen today for your complaint of back pain. Your discharge medications include Alternate tylenol and ibuprofen for pain. You may alternate these every 4 hours. You may take up to 800 mg of ibuprofen at a time and up to 1000 mg of tylenol. Flexeril.  This is a muscle relaxant.  Only take it as needed.  Do not drive or operate heavy machinery while taking this medication. Home care instructions are as follows:  Perform the back exercises as listed in this packet Follow up with: Your primary care provider in 1 week for reevaluation of your symptoms Please seek immediate medical care if you develop any of the following symptoms: Difficulty controlling your bowels or bladder, fevers, significantly worsening pain, difficulty walking At this time there does not appear to be the presence of an emergent medical condition, however there is always the potential for conditions to change. Please read and follow the below instructions.  Do not take your medicine if  develop an itchy rash, swelling in your mouth or lips, or difficulty breathing; call 911 and seek immediate emergency medical attention if this occurs.  You may review your lab tests and imaging results in their entirety on your MyChart account.  Please discuss all results of fully with your primary care provider and other specialist at your follow-up visit.  Note: Portions of this text may have been transcribed using voice recognition software. Every effort was made to ensure accuracy; however, inadvertent computerized transcription errors may still be present.

## 2022-06-20 NOTE — ED Provider Notes (Signed)
[facility] Provider Note   CSN: [id number] Arrival date & time: 06/20/22  1753     History  Chief Complaint  Patient presents with   Back Pain    Hector Fischer is a 37 y.o. male.  With history of scoliosis who presents the ED for evaluation of right-sided low back pain.  States this began approximately 5 days ago and has been intermittent but has gotten worse over the last 2 days.  He states he has caused some difficulty sleeping.  He has been taking Excedrin and Flexeril for his pain with mild some improvement which only lasts for approximately 1 hour.  He denies fevers, chills, injection drug use, saddle paresthesias, numbness, weakness, tingling, bowel or bladder dysfunction.  Denies trauma.   Back Pain      Home Medications Prior to Admission medications   Medication Sig Start Date End Date Taking? Authorizing Provider  aspirin-acetaminophen-caffeine (EXCEDRIN MIGRAINE) [phone] MG tablet Take 1 tablet by mouth every 6 (six) hours as needed for headache.   Yes [provider]  cyclobenzaprine (FLEXERIL) 10 MG tablet Take 1 tablet (10 mg total) by mouth 2 (two) times daily as needed for muscle spasms. 06/20/22  Yes Thane Age, Grafton Folk, PA-C  ibuprofen (ADVIL) 800 MG tablet Take 1 tablet (800 mg total) by mouth 3 (three) times daily. 06/20/22  Yes Milena Liggett, Grafton Folk, PA-C  brompheniramine-pseudoephedrine-DM 30-2-10 MG/5ML syrup Take 5 mLs by mouth 4 (four) times daily as needed. Patient not taking: Reported on 06/20/2022 09/18/16   Sable Feil, PA-C  doxycycline (VIBRAMYCIN) 100 MG capsule Take 1 capsule (100 mg total) by mouth 2 (two) times daily. Patient not taking: Reported on 06/20/2022 05/20/22   Small, Brooke L, PA  loratadine-pseudoephedrine (CLARITIN-D 12-HOUR) 5-120 MG tablet Take 1 tablet by mouth 2 (two) times daily. Patient not taking: Reported on 06/20/2022 01/17/15   Johnn Hai, PA-C  magic mouthwash  w/lidocaine SOLN Take 5 mLs by mouth 4 (four) times daily. Patient not taking: Reported on 06/20/2022 09/18/16   Sable Feil, PA-C  naproxen (NAPROSYN) 500 MG tablet Take 1 tablet (500 mg total) by mouth 2 (two) times daily with a meal. Patient not taking: Reported on 06/20/2022 03/30/15   Harvest Dark, PA-C      Allergies    Hydrocodone-acetaminophen    Review of Systems   Review of Systems  Musculoskeletal:  Positive for back pain.  All other systems reviewed and are negative.   Physical Exam Updated Vital Signs BP (!) 141/94 (BP Location: Left Arm)   Pulse 75   Temp 97.9 F (36.6 C) (Oral)   Resp 17   Ht '5\' 11"'$  (1.803 m)   Wt 104.3 kg   SpO2 100%   BMI 32.08 kg/m  Physical Exam Vitals and nursing note reviewed.  Constitutional:      General: He is not in acute distress.    Appearance: Normal appearance. He is normal weight. He is not ill-appearing.  HENT:     Head: Normocephalic and atraumatic.  Pulmonary:     Effort: Pulmonary effort is normal. No respiratory distress.  Abdominal:     General: Abdomen is flat.  Musculoskeletal:        General: Normal range of motion.     Cervical back: Neck supple.     Comments: No midline C, T or L-spine tenderness to palpation.  Mild paraspinal tenderness to palpation on the right side.  Straight leg  raise test negative bilaterally.  Skin:    General: Skin is warm and dry.     Capillary Refill: Capillary refill takes less than 2 seconds.  Neurological:     General: No focal deficit present.     Mental Status: He is alert and oriented to person, place, and time.  Psychiatric:        Mood and Affect: Mood normal.        Behavior: Behavior normal.     ED Results / Procedures / Treatments   Labs (all labs ordered are listed, but only abnormal results are displayed) Labs Reviewed - No data to display  EKG None  Radiology No results found.  Procedures Procedures    Medications Ordered in ED Medications - No  data to display  ED Course/ Medical Decision Making/ A&P                             Medical Decision Making This patient presents to the ED for concern of right-sided low back pain, this involves an extensive number of treatment options, and is a complaint that carries with it a high risk of complications and morbidity.  The emergent differential diagnosis for low back pain includes acute ligamentous and injury, cord compression syndrome, pathologic fracture, transverse myelitis, vertebral osteomyelitis, discitis, and epidural abscess   Co morbidities that complicate the patient evaluation  scoliosis  Additional history obtained from: Nursing notes from this visit.  Afebrile, hypertensive but otherwise hemodynamically stable.  37 year old male presenting to the ED for evaluation of right-sided low back pain.  No red flag symptoms.  No signs of sciatica.  No history of trauma.  No indication for imaging at this time.  Low suspicion for cord compression as the cause of his symptoms.  He was given a refill of his prescription strength ibuprofen and Flexeril.  He was given information regarding low back pain exercises.  He was encouraged to follow-up with his primary care provider for reevaluation of his scoliosis.  He was given return precautions.  Stable at discharge.  At this time there does not appear to be any evidence of an acute emergency medical condition and the patient appears stable for discharge with appropriate outpatient follow up. Diagnosis was discussed with patient who verbalizes understanding of care plan and is agreeable to discharge. I have discussed return precautions with patient who verbalizes understanding. Patient encouraged to follow-up with their PCP within 1 week. All questions answered.  Note: Portions of this report may have been transcribed using voice recognition software. Every effort was made to ensure accuracy; however, inadvertent computerized transcription errors  may still be present.        Final Clinical Impression(s) / ED Diagnoses Final diagnoses:  Acute right-sided low back pain without sciatica    Rx / DC Orders ED Discharge Orders          Ordered    ibuprofen (ADVIL) 800 MG tablet  3 times daily        06/20/22 2236    cyclobenzaprine (FLEXERIL) 10 MG tablet  2 times daily PRN        06/20/22 2236              Roylene Reason, PA-C 06/20/22 2251    Jeanell Sparrow, DO 06/21/22 [phone]

## 2022-06-20 NOTE — ED Provider Triage Note (Signed)
Emergency Medicine Provider Triage Evaluation Note  Hector Fischer , a 37 y.o. male  was evaluated in triage.  Pt complains of back pain.  Patient reports that he has been having upper back pain for the last 6 days or so without significant improvement.  He reports prior history of scoliosis.  Has been managing symptoms somewhat okay with muscle relaxers but reports that this relief is temporary.  Denies any chest pain, shortness of breath, cough, congestion.  Denies any other symptoms..  Review of Systems  Positive: As above Negative: As above  Physical Exam  BP (!) 160/110 (BP Location: Right Arm)   Pulse 89   Temp 98.4 F (36.9 C) (Oral)   Resp 16   Ht '5\' 11"'$  (1.803 m)   Wt 104.3 kg   SpO2 100%   BMI 32.08 kg/m  Gen:   Awake, no distress   Resp:  Normal effort  MSK:   Moves extremities without difficulty  Other:    Medical Decision Making  Medically screening exam initiated at 6:53 PM.  Appropriate orders placed.  NOYAN TOLEDO was informed that the remainder of the evaluation will be completed by another provider, this initial triage assessment does not replace that evaluation, and the importance of remaining in the ED until their evaluation is complete.     Luvenia Heller, PA-C 06/20/22 [phone]

## 2022-06-20 NOTE — ED Triage Notes (Signed)
Pt c/o back pain x6 days.  Pain score 7/10.  Hx of scoliosis.  Denies injury.  Sts he woke up with the pain.  Pt reports taking a muscle relaxer is brief relief.     Denies numbness and tingling.
